# Patient Record
Sex: Female | Born: 1960
Health system: Southern US, Community
[De-identification: ages and names within clinical notes are randomized; demographics above are authoritative.]

## PROBLEM LIST (undated history)

## (undated) DIAGNOSIS — K589 Irritable bowel syndrome without diarrhea: Secondary | ICD-10-CM

## (undated) DIAGNOSIS — K2100 Gastro-esophageal reflux disease with esophagitis, without bleeding: Secondary | ICD-10-CM

## (undated) DIAGNOSIS — F32A Depression, unspecified: Secondary | ICD-10-CM

## (undated) DIAGNOSIS — Z8041 Family history of malignant neoplasm of ovary: Secondary | ICD-10-CM

## (undated) DIAGNOSIS — R32 Unspecified urinary incontinence: Secondary | ICD-10-CM

## (undated) DIAGNOSIS — F329 Major depressive disorder, single episode, unspecified: Secondary | ICD-10-CM

## (undated) DIAGNOSIS — Z803 Family history of malignant neoplasm of breast: Secondary | ICD-10-CM

## (undated) DIAGNOSIS — E669 Obesity, unspecified: Secondary | ICD-10-CM

## (undated) DIAGNOSIS — G43909 Migraine, unspecified, not intractable, without status migrainosus: Secondary | ICD-10-CM

## (undated) DIAGNOSIS — F419 Anxiety disorder, unspecified: Secondary | ICD-10-CM

## (undated) DIAGNOSIS — Z1371 Encounter for nonprocreative screening for genetic disease carrier status: Secondary | ICD-10-CM

## (undated) HISTORY — PX: TENDON REPAIR: SHX5111

## (undated) HISTORY — PX: LIPOSUCTION: SHX10

## (undated) HISTORY — PX: TUBAL LIGATION: SHX77

## (undated) HISTORY — DX: Family history of malignant neoplasm of ovary: Z80.41

## (undated) HISTORY — DX: Gastro-esophageal reflux disease with esophagitis, without bleeding: K21.00

## (undated) HISTORY — DX: Major depressive disorder, single episode, unspecified: F32.9

## (undated) HISTORY — DX: Anxiety disorder, unspecified: F41.9

## (undated) HISTORY — DX: Obesity, unspecified: E66.9

## (undated) HISTORY — DX: Gastro-esophageal reflux disease with esophagitis: K21.0

## (undated) HISTORY — DX: Encounter for nonprocreative screening for genetic disease carrier status: Z13.71

## (undated) HISTORY — PX: CHOLECYSTECTOMY: SHX55

## (undated) HISTORY — DX: Depression, unspecified: F32.A

## (undated) HISTORY — DX: Irritable bowel syndrome, unspecified: K58.9

## (undated) HISTORY — PX: ENDOMETRIAL ABLATION: SHX621

## (undated) HISTORY — DX: Family history of malignant neoplasm of breast: Z80.3

## (undated) HISTORY — DX: Migraine, unspecified, not intractable, without status migrainosus: G43.909

## (undated) HISTORY — DX: Unspecified urinary incontinence: R32

---

## 2011-08-21 DIAGNOSIS — Z1371 Encounter for nonprocreative screening for genetic disease carrier status: Secondary | ICD-10-CM

## 2011-08-21 HISTORY — DX: Encounter for nonprocreative screening for genetic disease carrier status: Z13.71

## 2013-09-14 ENCOUNTER — Other Ambulatory Visit: Payer: Self-pay | Admitting: Gastroenterology

## 2013-09-14 LAB — CLOSTRIDIUM DIFFICILE(ARMC)

## 2013-09-17 LAB — STOOL CULTURE

## 2013-09-24 ENCOUNTER — Other Ambulatory Visit: Payer: Self-pay | Admitting: Gastroenterology

## 2013-09-24 LAB — CLOSTRIDIUM DIFFICILE(ARMC)

## 2013-10-12 ENCOUNTER — Ambulatory Visit: Payer: Self-pay | Admitting: Gastroenterology

## 2013-10-13 LAB — PATHOLOGY REPORT

## 2014-12-23 ENCOUNTER — Encounter: Payer: 59 | Attending: Internal Medicine | Admitting: Dietician

## 2014-12-23 VITALS — Ht 67.0 in | Wt 231.0 lb

## 2014-12-23 DIAGNOSIS — E669 Obesity, unspecified: Secondary | ICD-10-CM | POA: Diagnosis present

## 2014-12-23 NOTE — Progress Notes (Signed)
Pt. In as a nutrition self-referral. Time start: 1:30pm Time stop- 2:15pm.  Her priority is desire to lose weight. 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  Instructed on a meal plan based on 1600 calories including carbohydrate counting and need to balance carbohydrate, protein and non-starchy vegetables. Discussed benefit of exercise with weight loss efforts.  Pt. Set goals to: Measure portions, especially of starchy foods Balance meals with protein, 2-3 carbohydrate servings and non-starchy vegetables. Exercise by walking 5 dys/week for 3 miles To resume some fun hobbies.  No follow-up scheduled. Pt. Was encouraged to call if has further questions/concerns or desires further help with diet/nutrition.

## 2014-12-23 NOTE — Patient Instructions (Signed)
Pt. Set goals to: Measure portions, especially of starchy foods Balance meals with protein, 2-3 carbohydrate servings and non-starchy vegetables. Exercise by walking 5 dys/week for 3 miles To resume some fun hobbies.

## 2015-10-17 DIAGNOSIS — H01004 Unspecified blepharitis left upper eyelid: Secondary | ICD-10-CM | POA: Diagnosis not present

## 2015-12-29 DIAGNOSIS — Z0000 Encounter for general adult medical examination without abnormal findings: Secondary | ICD-10-CM | POA: Diagnosis not present

## 2015-12-29 DIAGNOSIS — E669 Obesity, unspecified: Secondary | ICD-10-CM | POA: Diagnosis not present

## 2015-12-29 DIAGNOSIS — F329 Major depressive disorder, single episode, unspecified: Secondary | ICD-10-CM | POA: Diagnosis not present

## 2016-06-07 DIAGNOSIS — L989 Disorder of the skin and subcutaneous tissue, unspecified: Secondary | ICD-10-CM | POA: Diagnosis not present

## 2016-06-08 ENCOUNTER — Encounter: Payer: Self-pay | Admitting: General Surgery

## 2016-06-20 ENCOUNTER — Ambulatory Visit: Payer: Self-pay | Admitting: General Surgery

## 2016-06-21 ENCOUNTER — Encounter: Payer: Self-pay | Admitting: *Deleted

## 2016-11-01 DIAGNOSIS — H524 Presbyopia: Secondary | ICD-10-CM | POA: Diagnosis not present

## 2016-11-01 DIAGNOSIS — H353111 Nonexudative age-related macular degeneration, right eye, early dry stage: Secondary | ICD-10-CM | POA: Diagnosis not present

## 2016-11-30 DIAGNOSIS — H353131 Nonexudative age-related macular degeneration, bilateral, early dry stage: Secondary | ICD-10-CM | POA: Diagnosis not present

## 2016-12-25 DIAGNOSIS — Z0000 Encounter for general adult medical examination without abnormal findings: Secondary | ICD-10-CM | POA: Diagnosis not present

## 2017-01-01 DIAGNOSIS — Z0000 Encounter for general adult medical examination without abnormal findings: Secondary | ICD-10-CM | POA: Diagnosis not present

## 2017-01-01 DIAGNOSIS — I201 Angina pectoris with documented spasm: Secondary | ICD-10-CM | POA: Diagnosis not present

## 2017-01-01 DIAGNOSIS — F329 Major depressive disorder, single episode, unspecified: Secondary | ICD-10-CM | POA: Diagnosis not present

## 2017-04-24 ENCOUNTER — Ambulatory Visit (INDEPENDENT_AMBULATORY_CARE_PROVIDER_SITE_OTHER): Payer: 59 | Admitting: Obstetrics and Gynecology

## 2017-04-24 ENCOUNTER — Encounter: Payer: Self-pay | Admitting: Obstetrics and Gynecology

## 2017-04-24 VITALS — BP 128/86 | Ht 67.0 in | Wt 227.0 lb

## 2017-04-24 DIAGNOSIS — Z1331 Encounter for screening for depression: Secondary | ICD-10-CM

## 2017-04-24 DIAGNOSIS — Z124 Encounter for screening for malignant neoplasm of cervix: Secondary | ICD-10-CM | POA: Diagnosis not present

## 2017-04-24 DIAGNOSIS — Z1339 Encounter for screening examination for other mental health and behavioral disorders: Secondary | ICD-10-CM

## 2017-04-24 DIAGNOSIS — Z1389 Encounter for screening for other disorder: Secondary | ICD-10-CM | POA: Diagnosis not present

## 2017-04-24 DIAGNOSIS — Z01419 Encounter for gynecological examination (general) (routine) without abnormal findings: Secondary | ICD-10-CM | POA: Diagnosis not present

## 2017-04-24 LAB — HM PAP SMEAR: HM PAP: NORMAL

## 2017-04-24 NOTE — Progress Notes (Signed)
Routine Annual Gynecology Examination   PCP: Lauro RegulusAnderson, Marshall W, MD   Chief Complaint  Patient presents with  . Annual Exam    History of Present Illness: Patient is a 56 y.o. G2P2002 presents for annual exam. The patient has no complaints today.   Menopausal bleeding: denies.   Menopausal symptoms: denies. She is taking HRT from Dr. Dareen PianoAnderson  Breast symptoms: denies  Last pap smear: 5 years ago.  Result Normal  Last mammogram: several years ago.  Result Normal   Routine screening accomplished by her PCP, Dr. Einar CrowMarshall Anderson  Past Medical History:  Diagnosis Date  . Anxiety   . Depression   . Gastro-esophageal reflux disease with esophagitis   . IBS (irritable bowel syndrome)   . Incontinence of urine   . Migraine   . Obesity     Past Surgical History:  Procedure Laterality Date  . CHOLECYSTECTOMY    . ENDOMETRIAL ABLATION    . LIPOSUCTION    . TENDON REPAIR    . TUBAL LIGATION      Prior to Admission medications   Medication Sig Start Date End Date Taking? Authorizing Provider  acetaminophen (TYLENOL) 325 MG tablet Take by mouth.    [provider]  estradiol (ESTRACE) 1 MG tablet Take by mouth. 06/18/14 06/18/15  [provider]  medroxyPROGESTERone (PROVERA) 2.5 MG tablet Take by mouth. 06/18/14 06/18/15  [provider]  propranolol (INDERAL) 20 MG tablet Take by mouth. 06/18/14 06/18/15  [provider]    Allergies  Allergen Reactions  . Miconazole Other (See Comments)    Burning and irritation    Obstetric History: [id number]  Social History   Social History  . Marital status: Married    Spouse name: N/A  . Number of children: N/A  . Years of education: N/A   Occupational History  . Not on file.   Social History Main Topics  . Smoking status: Never Smoker  . Smokeless tobacco: Never Used  . Alcohol use Yes  . Drug use: No  . Sexual activity: Yes    Birth control/ protection: Surgical    Other Topics Concern  . Not on file   Social History Narrative  . No narrative on file    Family History  Problem Relation Age of Onset  . Prostate cancer Father   . Prostate cancer Brother   . Ovarian cancer Paternal Aunt   . Colon cancer Paternal Aunt   . Esophageal cancer Paternal Uncle     Review of Systems  Constitutional: Negative.   HENT: Negative.   Eyes: Negative.   Respiratory: Negative.   Cardiovascular: Negative.   Gastrointestinal: Negative.   Genitourinary: Negative.   Musculoskeletal: Negative.   Skin: Negative.   Neurological: Negative.   Psychiatric/Behavioral: Negative.      Physical Exam Vitals: BP 128/86   Ht 5\' 7"  (1.702 m)   Wt 227 lb (103 kg)   BMI 35.55 kg/m   Physical Exam  Constitutional: She is oriented to person, place, and time. She appears well-developed and well-nourished. No distress.  Genitourinary: Vagina normal and uterus normal. Pelvic exam was performed with patient supine. There is no rash, tenderness or lesion on the right labia. There is no rash, tenderness or lesion on the left labia. Vagina exhibits no lesion. No bleeding in the vagina. No foreign body in the vagina. No signs of injury around the vagina. Right adnexum does not display mass, does not display tenderness and does not display fullness.  Left adnexum does not display mass, does not display tenderness and does not display fullness. Cervix does not exhibit motion tenderness, lesion or polyp.   Uterus is mobile and anteverted. Uterus is not enlarged or exhibiting a mass.  HENT:  Head: Normocephalic and atraumatic.  Eyes: EOM are normal. No scleral icterus.  Neck: Normal range of motion. Neck supple.  Cardiovascular: Normal rate and regular rhythm.   Pulmonary/Chest: Effort normal and breath sounds normal. No respiratory distress. She has no wheezes. She has no rales. Right breast exhibits no inverted nipple, no mass, no nipple discharge, no skin change and no  tenderness. Left breast exhibits no inverted nipple, no mass, no nipple discharge, no skin change and no tenderness.  Abdominal: Soft. Bowel sounds are normal. She exhibits no distension and no mass. There is no tenderness. There is no rebound and no guarding.  Musculoskeletal: Normal range of motion. She exhibits no edema.  Neurological: She is alert and oriented to person, place, and time. No cranial nerve deficit.  Skin: Skin is warm and dry. No erythema.  Psychiatric: She has a normal mood and affect. Her behavior is normal. Judgment normal.    Female chaperone present for pelvic and breast  portions of the physical exam  Results: AUDIT Questionnaire (screen for alcoholism): 2 PHQ-9: 1   Assessment and Plan:  56 y.o. G73P2002 female here for routine annual gynecologic examination  Plan: Problem List Items Addressed This Visit    None      Screening: -- Blood pressure screen normal -- Colonoscopy - due - managed by PCP -- Mammogram - due. Patient to call [facility] to arrange. She understands that it is her responsibility to arrange this. -- Weight screening: obese: discussed management options, including lifestyle, dietary, and exercise. -- Depression screening negative (PHQ-9) -- Nutrition: normal -- cholesterol screening: per PCP -- osteoporosis screening: not due -- tobacco screening: not using -- alcohol screening: AUDIT questionnaire indicates low-risk usage. -- family history of breast cancer screening: done. not at high risk. -- no evidence of domestic violence or intimate partner violence. -- STD screening: gonorrhea/chlamydia NAAT not collected per patient request. -- pap smear collected per ASCCP guidelines -- flu vaccine to receive at work -- HPV vaccination series: not Kristen Loader, MD 04/24/2017 3:14 PM

## 2017-04-27 LAB — IGP, APTIMA HPV, RFX 16/18,45
HPV APTIMA: NEGATIVE
PAP Smear Comment: 0

## 2017-04-29 ENCOUNTER — Encounter: Payer: Self-pay | Admitting: Obstetrics and Gynecology

## 2017-06-24 DIAGNOSIS — M5442 Lumbago with sciatica, left side: Secondary | ICD-10-CM | POA: Diagnosis not present

## 2017-06-24 DIAGNOSIS — M545 Low back pain: Secondary | ICD-10-CM | POA: Diagnosis not present

## 2017-08-26 DIAGNOSIS — M5431 Sciatica, right side: Secondary | ICD-10-CM | POA: Diagnosis not present

## 2017-09-07 DIAGNOSIS — M545 Low back pain: Secondary | ICD-10-CM | POA: Diagnosis not present

## 2017-09-16 DIAGNOSIS — M5136 Other intervertebral disc degeneration, lumbar region: Secondary | ICD-10-CM | POA: Diagnosis not present

## 2017-09-16 DIAGNOSIS — M5416 Radiculopathy, lumbar region: Secondary | ICD-10-CM | POA: Diagnosis not present

## 2017-10-26 DIAGNOSIS — J1189 Influenza due to unidentified influenza virus with other manifestations: Secondary | ICD-10-CM | POA: Diagnosis not present

## 2017-10-29 MED FILL — DUKE'S MOUTHWASH: 7 days supply | Qty: 280 | Fill #0

## 2017-11-11 DIAGNOSIS — H353111 Nonexudative age-related macular degeneration, right eye, early dry stage: Secondary | ICD-10-CM | POA: Diagnosis not present

## 2017-11-11 DIAGNOSIS — M5136 Other intervertebral disc degeneration, lumbar region: Secondary | ICD-10-CM | POA: Diagnosis not present

## 2017-11-11 DIAGNOSIS — H524 Presbyopia: Secondary | ICD-10-CM | POA: Diagnosis not present

## 2017-11-11 DIAGNOSIS — M5416 Radiculopathy, lumbar region: Secondary | ICD-10-CM | POA: Diagnosis not present

## 2017-12-02 DIAGNOSIS — H9202 Otalgia, left ear: Secondary | ICD-10-CM | POA: Diagnosis not present

## 2017-12-02 DIAGNOSIS — R05 Cough: Secondary | ICD-10-CM | POA: Diagnosis not present

## 2017-12-02 DIAGNOSIS — J029 Acute pharyngitis, unspecified: Secondary | ICD-10-CM | POA: Diagnosis not present

## 2017-12-05 DIAGNOSIS — J029 Acute pharyngitis, unspecified: Secondary | ICD-10-CM | POA: Diagnosis not present

## 2017-12-05 DIAGNOSIS — H9202 Otalgia, left ear: Secondary | ICD-10-CM | POA: Diagnosis not present

## 2017-12-13 DIAGNOSIS — H9192 Unspecified hearing loss, left ear: Secondary | ICD-10-CM | POA: Diagnosis not present

## 2017-12-13 DIAGNOSIS — R209 Unspecified disturbances of skin sensation: Secondary | ICD-10-CM | POA: Diagnosis not present

## 2017-12-21 DIAGNOSIS — B349 Viral infection, unspecified: Secondary | ICD-10-CM | POA: Diagnosis not present

## 2017-12-21 DIAGNOSIS — K521 Toxic gastroenteritis and colitis: Secondary | ICD-10-CM | POA: Diagnosis not present

## 2017-12-23 ENCOUNTER — Other Ambulatory Visit
Admission: RE | Admit: 2017-12-23 | Discharge: 2017-12-23 | Disposition: A | Discharge status: Home / Self Care | Payer: 59 | Source: Ambulatory Visit | Attending: Family Medicine | Admitting: Family Medicine

## 2017-12-23 DIAGNOSIS — B349 Viral infection, unspecified: Secondary | ICD-10-CM | POA: Insufficient documentation

## 2017-12-23 DIAGNOSIS — K521 Toxic gastroenteritis and colitis: Secondary | ICD-10-CM | POA: Insufficient documentation

## 2017-12-23 LAB — C DIFFICILE QUICK SCREEN W PCR REFLEX
C Diff antigen: NEGATIVE
C Diff interpretation: NOT DETECTED
C Diff toxin: NEGATIVE

## 2017-12-24 ENCOUNTER — Other Ambulatory Visit: Payer: Self-pay | Admitting: Physician Assistant

## 2017-12-24 DIAGNOSIS — J019 Acute sinusitis, unspecified: Secondary | ICD-10-CM

## 2017-12-24 DIAGNOSIS — H6980 Other specified disorders of Eustachian tube, unspecified ear: Secondary | ICD-10-CM | POA: Diagnosis not present

## 2017-12-24 DIAGNOSIS — H93299 Other abnormal auditory perceptions, unspecified ear: Secondary | ICD-10-CM | POA: Diagnosis not present

## 2017-12-24 DIAGNOSIS — H9042 Sensorineural hearing loss, unilateral, left ear, with unrestricted hearing on the contralateral side: Secondary | ICD-10-CM | POA: Diagnosis not present

## 2017-12-30 DIAGNOSIS — I201 Angina pectoris with documented spasm: Secondary | ICD-10-CM | POA: Diagnosis not present

## 2017-12-30 DIAGNOSIS — Z0000 Encounter for general adult medical examination without abnormal findings: Secondary | ICD-10-CM | POA: Diagnosis not present

## 2017-12-31 ENCOUNTER — Ambulatory Visit
Admission: RE | Admit: 2017-12-31 | Discharge: 2017-12-31 | Disposition: A | Discharge status: Home / Self Care | Payer: 59 | Source: Ambulatory Visit | Attending: Physician Assistant | Admitting: Physician Assistant

## 2017-12-31 DIAGNOSIS — J329 Chronic sinusitis, unspecified: Secondary | ICD-10-CM | POA: Diagnosis present

## 2017-12-31 DIAGNOSIS — J328 Other chronic sinusitis: Secondary | ICD-10-CM | POA: Insufficient documentation

## 2017-12-31 DIAGNOSIS — J320 Chronic maxillary sinusitis: Secondary | ICD-10-CM | POA: Diagnosis not present

## 2017-12-31 DIAGNOSIS — J018 Other acute sinusitis: Secondary | ICD-10-CM | POA: Diagnosis not present

## 2017-12-31 DIAGNOSIS — J019 Acute sinusitis, unspecified: Secondary | ICD-10-CM

## 2018-01-06 DIAGNOSIS — I201 Angina pectoris with documented spasm: Secondary | ICD-10-CM | POA: Diagnosis not present

## 2018-01-06 DIAGNOSIS — F329 Major depressive disorder, single episode, unspecified: Secondary | ICD-10-CM | POA: Diagnosis not present

## 2018-01-06 DIAGNOSIS — Z0000 Encounter for general adult medical examination without abnormal findings: Secondary | ICD-10-CM | POA: Diagnosis not present

## 2018-01-09 DIAGNOSIS — J4520 Mild intermittent asthma, uncomplicated: Secondary | ICD-10-CM | POA: Diagnosis not present

## 2018-01-09 DIAGNOSIS — K219 Gastro-esophageal reflux disease without esophagitis: Secondary | ICD-10-CM | POA: Diagnosis not present

## 2018-01-09 DIAGNOSIS — R05 Cough: Secondary | ICD-10-CM | POA: Diagnosis not present

## 2018-01-09 DIAGNOSIS — J309 Allergic rhinitis, unspecified: Secondary | ICD-10-CM | POA: Diagnosis not present

## 2018-01-09 DIAGNOSIS — J342 Deviated nasal septum: Secondary | ICD-10-CM | POA: Diagnosis not present

## 2018-01-09 DIAGNOSIS — J3489 Other specified disorders of nose and nasal sinuses: Secondary | ICD-10-CM | POA: Diagnosis not present

## 2018-01-09 DIAGNOSIS — J329 Chronic sinusitis, unspecified: Secondary | ICD-10-CM | POA: Diagnosis not present

## 2018-01-09 DIAGNOSIS — J019 Acute sinusitis, unspecified: Secondary | ICD-10-CM | POA: Diagnosis not present

## 2018-02-10 DIAGNOSIS — K219 Gastro-esophageal reflux disease without esophagitis: Secondary | ICD-10-CM | POA: Diagnosis not present

## 2018-02-10 DIAGNOSIS — J309 Allergic rhinitis, unspecified: Secondary | ICD-10-CM | POA: Diagnosis not present

## 2018-02-10 DIAGNOSIS — R05 Cough: Secondary | ICD-10-CM | POA: Diagnosis not present

## 2018-09-08 ENCOUNTER — Ambulatory Visit (INDEPENDENT_AMBULATORY_CARE_PROVIDER_SITE_OTHER): Payer: No Typology Code available for payment source | Admitting: Obstetrics and Gynecology

## 2018-09-08 ENCOUNTER — Encounter: Payer: Self-pay | Admitting: Obstetrics and Gynecology

## 2018-09-08 VITALS — BP 124/78 | Ht 67.0 in | Wt 218.0 lb

## 2018-09-08 DIAGNOSIS — Z01419 Encounter for gynecological examination (general) (routine) without abnormal findings: Secondary | ICD-10-CM

## 2018-09-08 DIAGNOSIS — Z1331 Encounter for screening for depression: Secondary | ICD-10-CM

## 2018-09-08 DIAGNOSIS — Z1339 Encounter for screening examination for other mental health and behavioral disorders: Secondary | ICD-10-CM

## 2018-09-08 NOTE — Progress Notes (Signed)
Routine Annual Gynecology Examination   PCP: Kirk Ruths, MD  Chief Complaint  Patient presents with  . Annual Exam   History of Present Illness: Patient is a 58 y.o. [id number] presents for annual exam. The patient has no complaints today.   Menopausal bleeding: denies  Menopausal symptoms: denies, taking HRT from Dr. Ouida Sills, who is managing this currently.   Breast symptoms: denies  Last pap smear: 1 year ago.  Result Normal, HPV negative  Last mammogram: several years ago.  Result Normal  Colonoscopy: not done so far.  Dr. Ouida Sills to refer, per the patient.  Past Medical History:  Diagnosis Date  . Anxiety   . BRCA negative 2013   BRCA/Colaris neg  . Depression   . Family history of breast cancer    updated genetic testing letter sent 9/18  . Family history of ovarian cancer   . Gastro-esophageal reflux disease with esophagitis   . IBS (irritable bowel syndrome)   . Incontinence of urine   . Migraine   . Obesity    Past Surgical History:  Procedure Laterality Date  . CHOLECYSTECTOMY    . ENDOMETRIAL ABLATION    . LIPOSUCTION    . TENDON REPAIR    . TUBAL LIGATION     Prior to Admission medications   Medication Sig Start Date End Date Taking? Authorizing Provider  acetaminophen (TYLENOL) 325 MG tablet Take by mouth.   Yes [provider]  buPROPion (WELLBUTRIN XL) 150 MG 24 hr tablet  06/18/18  Yes [provider]  estradiol (ESTRACE) 1 MG tablet  06/18/18  Yes [provider]  gabapentin (NEURONTIN) 100 MG capsule  08/28/18  Yes [provider]  medroxyPROGESTERone (PROVERA) 2.5 MG tablet  06/18/18  Yes [provider]  estradiol (ESTRACE) 1 MG tablet Take by mouth. 06/18/14 06/18/15  [provider]  medroxyPROGESTERone (PROVERA) 2.5 MG tablet Take by mouth. 06/18/14 06/18/15  [provider]  propranolol (INDERAL) 20 MG tablet Take by mouth. 06/18/14 06/18/15  [provider]    Allergies  Allergen Reactions  . Miconazole Other (See Comments)    Burning and irritation   Obstetric History: [id number], s/p SVD x 2  Social History   Socioeconomic History  . Marital status: Married    Spouse name: Not on file  . Number of children: Not on file  . Years of education: Not on file  . Highest education level: Not on file  Occupational History  . Not on file  Social Needs  . Financial resource strain: Not on file  . Food insecurity:    Worry: Not on file    Inability: Not on file  . Transportation needs:    Medical: Not on file    Non-medical: Not on file  Tobacco Use  . Smoking status: Never Smoker  . Smokeless tobacco: Never Used  Substance and Sexual Activity  . Alcohol use: Yes  . Drug use: No  . Sexual activity: Yes    Birth control/protection: Surgical  Lifestyle  . Physical activity:    Days per week: Not on file    Minutes per session: Not on file  . Stress: Not on file  Relationships  . Social connections:    Talks on phone: Not on file    Gets together: Not on file    Attends religious service: Not on file    Active member of club or organization: Not on file    Attends meetings of clubs or  organizations: Not on file    Relationship status: Not on file  . Intimate partner violence:    Fear of current or ex partner: Not on file    Emotionally abused: Not on file    Physically abused: Not on file    Forced sexual activity: Not on file  Other Topics Concern  . Not on file  Social History Narrative  . Not on file    Family History  Problem Relation Age of Onset  . Prostate cancer Father   . Prostate cancer Brother   . Ovarian cancer Paternal Aunt   . Colon cancer Paternal Aunt   . Breast cancer Paternal Aunt   . Esophageal cancer Paternal Uncle   . Breast cancer Maternal Grandmother     Review of Systems  Constitutional: Negative.   HENT: Negative.   Eyes: Negative.   Respiratory: Negative.   Cardiovascular: Negative.     Gastrointestinal: Negative.   Genitourinary: Negative.   Musculoskeletal: Negative.   Skin: Negative.   Neurological: Negative.   Psychiatric/Behavioral: Negative.      Physical Exam Vitals: BP 124/78   Ht _0  (1.702 m)   Wt 218 lb (98.9 kg)   BMI 34.14 kg/m   Physical Exam Constitutional:      General: She is not in acute distress.    Appearance: Normal appearance. She is well-developed.  Genitourinary:     Pelvic exam was performed with patient supine.     Uterus normal.     No inguinal adenopathy present in the right or left side.    No signs of injury in the vagina.     No vaginal discharge, erythema, tenderness or bleeding.     No cervical motion tenderness, discharge, lesion or polyp.     Uterus is mobile.     Uterus is not enlarged or tender.     No uterine mass detected.    Uterus is anteverted.     No right or left adnexal mass present.     Right adnexa not tender or full.     Left adnexa not tender or full.  HENT:     Head: Normocephalic and atraumatic.  Eyes:     General: No scleral icterus.    Conjunctiva/sclera: Conjunctivae normal.  Neck:     Musculoskeletal: Normal range of motion and neck supple.     Thyroid: No thyromegaly.  Cardiovascular:     Rate and Rhythm: Normal rate and regular rhythm.     Heart sounds: No murmur. No friction rub. No gallop.   Pulmonary:     Effort: Pulmonary effort is normal. No respiratory distress.     Breath sounds: Normal breath sounds. No wheezing or rales.  Chest:     Breasts:        Right: No inverted nipple, mass, nipple discharge, skin change or tenderness.        Left: No inverted nipple, mass, nipple discharge, skin change or tenderness.  Abdominal:     General: Bowel sounds are normal. There is no distension.     Palpations: Abdomen is soft. There is no mass.     Tenderness: There is no abdominal tenderness. There is no guarding or rebound.  Musculoskeletal: Normal range of motion.        General: No  tenderness.  Lymphadenopathy:     Cervical: No cervical adenopathy.     Lower Body: No right inguinal adenopathy. No left inguinal adenopathy.  Neurological:     General:  No focal deficit present.     Mental Status: She is alert and oriented to person, place, and time.     Cranial Nerves: No cranial nerve deficit.  Skin:    General: Skin is warm and dry.     Findings: No erythema or rash.  Psychiatric:        Mood and Affect: Mood normal.        Behavior: Behavior normal.        Judgment: Judgment normal.      Female chaperone present for pelvic and breast  portions of the physical exam  Results: AUDIT Questionnaire (screen for alcoholism): 2 PHQ-9: 0   Assessment and Plan:  58 y.o. G2P2002 female here for routine annual gynecologic examination  Plan: Problem List Items Addressed This Visit    None    Visit Diagnoses    Women's annual routine gynecological examination    -  Primary   Screening for depression       Screening for alcoholism          Screening: -- Blood pressure screen normal -- Colonoscopy - due - managed by PCP -- Mammogram - due. Patient to call [facility] to arrange. She understands that it is her responsibility to arrange this. -- Weight screening: obese: discussed management options, including lifestyle, dietary, and exercise. -- Depression screening negative (PHQ-9) -- Nutrition: normal -- cholesterol screening: per PCP -- osteoporosis screening: not due -- tobacco screening: not using -- alcohol screening: AUDIT questionnaire indicates low-risk usage. -- family history of breast cancer screening: done. not at high risk. -- no evidence of domestic violence or intimate partner violence. -- STD screening: gonorrhea/chlamydia NAAT not collected per patient request. -- pap smear not collected per ASCCP guidelines -- flu vaccine received -- HPV vaccination series: not eligilbe  Encouraged mammography and colonoscopy, both of which she states she  will obtain.    Prentice Docker, MD 09/08/2018 12:20 PM

## 2018-09-10 ENCOUNTER — Encounter: Payer: Self-pay | Admitting: Obstetrics and Gynecology

## 2018-09-12 ENCOUNTER — Other Ambulatory Visit: Payer: Self-pay | Admitting: Obstetrics and Gynecology

## 2018-09-12 DIAGNOSIS — Z1231 Encounter for screening mammogram for malignant neoplasm of breast: Secondary | ICD-10-CM

## 2018-09-26 ENCOUNTER — Telehealth: Payer: Self-pay

## 2018-09-26 NOTE — Telephone Encounter (Signed)
SDJ this is an Pharmacist, hospital. It looks like [facility] sent patient a letter in January stating she is eligible for update testing through [facility].

## 2018-09-26 NOTE — Telephone Encounter (Signed)
OK. I would double-check in [facility] and maybe see the green sheet she filled out at her last appointment to make sure it is complete.  Thank you

## 2018-09-26 NOTE — Telephone Encounter (Signed)
I received a call from [employer] from [facility]. She is needing clinical notes on patient regarding BRCA testing. They need detailed family history of patient. Fax# [phone] Case# [id number]  I will fax last clinical notes from Dr. Glennon Mac. KJ CMA

## 2018-10-01 ENCOUNTER — Ambulatory Visit
Admission: RE | Admit: 2018-10-01 | Discharge: 2018-10-01 | Disposition: A | Discharge status: Home / Self Care | Payer: No Typology Code available for payment source | Source: Ambulatory Visit | Attending: Obstetrics and Gynecology | Admitting: Obstetrics and Gynecology

## 2018-10-01 DIAGNOSIS — Z539 Procedure and treatment not carried out, unspecified reason: Secondary | ICD-10-CM | POA: Insufficient documentation

## 2018-10-01 DIAGNOSIS — Z1231 Encounter for screening mammogram for malignant neoplasm of breast: Secondary | ICD-10-CM | POA: Diagnosis not present

## 2019-09-30 ENCOUNTER — Ambulatory Visit: Payer: No Typology Code available for payment source | Admitting: Obstetrics and Gynecology

## 2019-10-28 ENCOUNTER — Other Ambulatory Visit: Payer: Self-pay

## 2019-10-28 ENCOUNTER — Encounter: Payer: Self-pay | Admitting: Obstetrics and Gynecology

## 2019-10-28 ENCOUNTER — Ambulatory Visit (INDEPENDENT_AMBULATORY_CARE_PROVIDER_SITE_OTHER): Payer: No Typology Code available for payment source | Admitting: Obstetrics and Gynecology

## 2019-10-28 ENCOUNTER — Telehealth: Payer: Self-pay

## 2019-10-28 VITALS — BP 126/84 | Ht 67.0 in | Wt 227.0 lb

## 2019-10-28 DIAGNOSIS — Z8371 Family history of colonic polyps: Secondary | ICD-10-CM

## 2019-10-28 DIAGNOSIS — Z1339 Encounter for screening examination for other mental health and behavioral disorders: Secondary | ICD-10-CM

## 2019-10-28 DIAGNOSIS — Z1211 Encounter for screening for malignant neoplasm of colon: Secondary | ICD-10-CM

## 2019-10-28 DIAGNOSIS — Z1331 Encounter for screening for depression: Secondary | ICD-10-CM | POA: Diagnosis not present

## 2019-10-28 DIAGNOSIS — Z01419 Encounter for gynecological examination (general) (routine) without abnormal findings: Secondary | ICD-10-CM | POA: Diagnosis not present

## 2019-10-28 NOTE — Telephone Encounter (Signed)
Gastroenterology Pre-Procedure Review  Request Date: Friday 12/11/19 Requesting Physician: Dr. Servando Snare  PATIENT REVIEW QUESTIONS: The patient responded to the following health history questions as indicated:    1. Are you having any GI issues? yes (chronic IBS-D) 2. Do you have a personal history of Polyps? no 3. Do you have a family history of Colon Cancer or Polyps? yes (paternal grandmother) 4. Diabetes Mellitus? no 5. Joint replacements in the past 12 months?no 6. Major health problems in the past 3 months?no 7. Any artificial heart valves, MVP, or defibrillator?no    MEDICATIONS & ALLERGIES:    Patient reports the following regarding taking any anticoagulation/antiplatelet therapy:   Plavix, Coumadin, Eliquis, Xarelto, Lovenox, Pradaxa, Brilinta, or Effient? no Aspirin? no  Patient confirms/reports the following medications:  Current Outpatient Medications  Medication Sig Dispense Refill  . acetaminophen (TYLENOL) 325 MG tablet Take by mouth.    Marland Kitchen buPROPion (WELLBUTRIN XL) 150 MG 24 hr tablet     . estradiol (ESTRACE) 1 MG tablet Take by mouth.    . estradiol (ESTRACE) 1 MG tablet     . gabapentin (NEURONTIN) 100 MG capsule     . medroxyPROGESTERone (PROVERA) 2.5 MG tablet Take by mouth.    . medroxyPROGESTERone (PROVERA) 2.5 MG tablet     . propranolol (INDERAL) 20 MG tablet Take by mouth.     No current facility-administered medications for this visit.    Patient confirms/reports the following allergies:  Allergies  Allergen Reactions  . Oseltamivir Other (See Comments)    Mouth sore, swollen joints  . Miconazole Other (See Comments)    Burning and irritation  . Hyoscyamine Sulfate Rash    No orders of the defined types were placed in this encounter.   AUTHORIZATION INFORMATION Primary Insurance: 1D#: Group #:  Secondary Insurance: 1D#: Group #:  SCHEDULE INFORMATION: Date: Friday 12/11/19 Time: Location:[facility]

## 2019-10-28 NOTE — Progress Notes (Signed)
Routine Annual Gynecology Examination   PCP: Kirk Ruths, MD  Chief Complaint  Patient presents with  . Annual Exam   History of Present Illness: Patient is a 59 y.o. G2P2002 presents for annual exam. The patient has no complaints today.   Menopausal bleeding: denies  Menopausal symptoms: she notes some warmth in the middle of the night, but doesn't break out into a sweat. She has some cold intolerance in her feet.  Breast symptoms: denies  Last pap smear: 2.5 years ago.  Result Normal, HPV negative  Last mammogram: 1 years ago.  Result Normal.  Will schedule mammogram after this appt.   Last Colonoscopy: still has not had.  Will place referral.   Has received both covid vaccine shots.   She continues to wean her HRT.  She is down to twice a week now.    Past Medical History:  Diagnosis Date  . Anxiety   . BRCA negative 2013   BRCA/Colaris neg  . Depression   . Family history of breast cancer    updated genetic testing letter sent 9/18, 1/20  . Family history of ovarian cancer   . Gastro-esophageal reflux disease with esophagitis   . IBS (irritable bowel syndrome)   . Incontinence of urine   . Migraine   . Obesity     Past Surgical History:  Procedure Laterality Date  . CHOLECYSTECTOMY    . ENDOMETRIAL ABLATION    . LIPOSUCTION    . TENDON REPAIR    . TUBAL LIGATION      Prior to Admission medications   Medication Sig Start Date End Date Taking? Authorizing Provider  acetaminophen (TYLENOL) 325 MG tablet Take by mouth.   Yes [provider]  estradiol (ESTRACE) 1 MG tablet  06/18/18  Yes [provider]  medroxyPROGESTERone (PROVERA) 2.5 MG tablet  06/18/18  Yes [provider]  buPROPion (WELLBUTRIN XL) 150 MG 24 hr tablet  06/18/18   [provider]  estradiol (ESTRACE) 1 MG tablet Take by mouth. 06/18/14 06/18/15  [provider]  gabapentin (NEURONTIN) 100 MG capsule  08/28/18   [provider]  medroxyPROGESTERone (PROVERA) 2.5 MG tablet Take by mouth. 06/18/14 06/18/15  [provider]  propranolol (INDERAL) 20 MG tablet Take by mouth. 06/18/14 06/18/15  [provider]    Allergies  Allergen Reactions  . Miconazole Other (See Comments)    Burning and irritation   Obstetric History: [id number]  Social History   Socioeconomic History  . Marital status: Married    Spouse name: Not on file  . Number of children: Not on file  . Years of education: Not on file  . Highest education level: Not on file  Occupational History  . Not on file  Tobacco Use  . Smoking status: Never Smoker  . Smokeless tobacco: Never Used  Substance and Sexual Activity  . Alcohol use: Yes  . Drug use: No  . Sexual activity: Yes    Birth control/protection: Surgical  Other Topics Concern  . Not on file  Social History Narrative  . Not on file   Social Determinants of Health   Financial Resource Strain:   . Difficulty of Paying Living Expenses: Not on file  Food Insecurity:   . Worried About Charity fundraiser in the Last Year: Not on file  . Ran Out of Food in the Last Year: Not on file  Transportation Needs:   . Lack of Transportation (Medical): Not on  file  . Lack of Transportation (Non-Medical): Not on file  Physical Activity:   . Days of Exercise per Week: Not on file  . Minutes of Exercise per Session: Not on file  Stress:   . Feeling of Stress : Not on file  Social Connections:   . Frequency of Communication with Friends and Family: Not on file  . Frequency of Social Gatherings with Friends and Family: Not on file  . Attends Religious Services: Not on file  . Active Member of Clubs or Organizations: Not on file  . Attends Archivist Meetings: Not on file  . Marital Status: Not on file  Intimate Partner Violence:   . Fear of Current or Ex-Partner: Not on file  . Emotionally Abused: Not on file  . Physically Abused: Not on file  .  Sexually Abused: Not on file    Family History  Problem Relation Age of Onset  . Prostate cancer Father   . Prostate cancer Brother   . Ovarian cancer Paternal Aunt   . Colon cancer Paternal Aunt   . Breast cancer Paternal Aunt   . Esophageal cancer Paternal Uncle   . Breast cancer Maternal Grandmother    Review of Systems  Constitutional: Negative.   HENT: Negative.   Eyes: Negative.   Respiratory: Positive for shortness of breath (believes has to do with mask. Has seen ENT). Negative for cough, hemoptysis, sputum production and wheezing.   Cardiovascular: Negative.   Gastrointestinal: Positive for diarrhea (normal for her). Negative for abdominal pain, blood in stool, constipation, heartburn, melena, nausea and vomiting.       Change in appetite - more hunger.  When she eats, she feels more bloated and uncomfortable.  Genitourinary: Negative.   Musculoskeletal: Negative.   Skin: Negative.   Neurological: Positive for headaches (her normal headaches). Negative for dizziness, tingling, tremors, sensory change, speech change, focal weakness, seizures, loss of consciousness and weakness.  Psychiatric/Behavioral: Negative.      Physical Exam Vitals: BP 126/84   Ht '5\' 7"'$  (1.702 m)   Wt 227 lb (103 kg)   BMI 35.55 kg/m   Physical Exam Constitutional:      General: She is not in acute distress.    Appearance: Normal appearance. She is well-developed.  Genitourinary:     Pelvic exam was performed with patient supine.     Vulva, urethra, bladder and uterus normal.     No inguinal adenopathy present in the right or left side.    No signs of injury in the vagina.     No vaginal discharge, erythema, tenderness or bleeding.     No cervical motion tenderness, discharge, lesion or polyp.     Uterus is mobile.     Uterus is not enlarged or tender.     No uterine mass detected.    Uterus is anteverted.     No right or left adnexal mass present.     Right adnexa not tender or full.      Left adnexa not tender or full.  HENT:     Head: Normocephalic and atraumatic.  Eyes:     General: No scleral icterus.    Conjunctiva/sclera: Conjunctivae normal.  Neck:     Thyroid: No thyromegaly.  Cardiovascular:     Rate and Rhythm: Normal rate and regular rhythm.     Heart sounds: No murmur. No friction rub. No gallop.   Pulmonary:     Effort: Pulmonary effort is normal. No respiratory distress.  Breath sounds: Normal breath sounds. No wheezing or rales.  Chest:     Breasts:        Right: No inverted nipple, mass, nipple discharge, skin change or tenderness.        Left: No inverted nipple, mass, nipple discharge, skin change or tenderness.  Abdominal:     General: Bowel sounds are normal. There is no distension.     Palpations: Abdomen is soft. There is no mass.     Tenderness: There is no abdominal tenderness. There is no guarding or rebound.  Musculoskeletal:        General: No swelling or tenderness. Normal range of motion.     Cervical back: Normal range of motion and neck supple.  Lymphadenopathy:     Cervical: No cervical adenopathy.     Lower Body: No right inguinal adenopathy. No left inguinal adenopathy.  Neurological:     General: No focal deficit present.     Mental Status: She is alert and oriented to person, place, and time.     Cranial Nerves: No cranial nerve deficit.  Skin:    General: Skin is warm and dry.     Findings: No erythema or rash.  Psychiatric:        Mood and Affect: Mood normal.        Behavior: Behavior normal.        Judgment: Judgment normal.     Female chaperone present for pelvic and breast  portions of the physical exam  Results: AUDIT Questionnaire (screen for alcoholism): 2 PHQ-9: 1   Assessment and Plan:  59 y.o. G29P2002 female here for routine annual gynecologic examination  Plan: Problem List Items Addressed This Visit    None    Visit Diagnoses    Women's annual routine gynecological examination    -   Primary   Relevant Orders   Ambulatory referral to Gastroenterology   Screening for depression       Screening for alcoholism       Screen for colon cancer       Relevant Orders   Ambulatory referral to Gastroenterology      Screening: -- Blood pressure screen normal -- Colonoscopy - due - will schedule -- Mammogram - due. Patient to call [facility] to arrange. She understands that it is her responsibility to arrange this. -- Weight screening: overweight: continue to monitor -- Depression screening negative (PHQ-9) -- Nutrition: normal -- cholesterol screening: per PCP -- osteoporosis screening: not due -- tobacco screening: not using -- alcohol screening: AUDIT questionnaire indicates low-risk usage. -- family history of breast cancer screening: done. not at high risk. -- no evidence of domestic violence or intimate partner violence. -- STD screening: gonorrhea/chlamydia NAAT not collected per patient request. -- pap smear not collected per ASCCP guidelines -- flu vaccine received at work -- HPV vaccination series: not Lanetta Inch, MD 10/28/2019 10:23 AM

## 2019-11-02 ENCOUNTER — Other Ambulatory Visit: Payer: Self-pay | Admitting: Obstetrics and Gynecology

## 2019-11-02 DIAGNOSIS — Z1231 Encounter for screening mammogram for malignant neoplasm of breast: Secondary | ICD-10-CM

## 2019-11-18 ENCOUNTER — Ambulatory Visit
Admission: RE | Admit: 2019-11-18 | Discharge: 2019-11-18 | Disposition: A | Discharge status: Home / Self Care | Payer: No Typology Code available for payment source | Source: Ambulatory Visit | Attending: Obstetrics and Gynecology | Admitting: Obstetrics and Gynecology

## 2019-11-18 DIAGNOSIS — Z1231 Encounter for screening mammogram for malignant neoplasm of breast: Secondary | ICD-10-CM | POA: Insufficient documentation

## 2019-12-09 ENCOUNTER — Other Ambulatory Visit: Payer: Self-pay

## 2019-12-09 ENCOUNTER — Other Ambulatory Visit
Admission: RE | Admit: 2019-12-09 | Discharge: 2019-12-09 | Disposition: A | Discharge status: Home / Self Care | Payer: No Typology Code available for payment source | Source: Ambulatory Visit | Attending: Gastroenterology | Admitting: Gastroenterology

## 2019-12-09 DIAGNOSIS — Z01812 Encounter for preprocedural laboratory examination: Secondary | ICD-10-CM | POA: Diagnosis present

## 2019-12-09 DIAGNOSIS — Z20822 Contact with and (suspected) exposure to covid-19: Secondary | ICD-10-CM | POA: Insufficient documentation

## 2019-12-09 LAB — SARS CORONAVIRUS 2 (TAT 6-24 HRS): SARS Coronavirus 2: NEGATIVE

## 2019-12-10 ENCOUNTER — Encounter: Payer: Self-pay | Admitting: Gastroenterology

## 2019-12-11 ENCOUNTER — Ambulatory Visit
Admission: RE | Admit: 2019-12-11 | Discharge: 2019-12-11 | Disposition: A | Discharge status: Home / Self Care | Payer: No Typology Code available for payment source | Attending: Gastroenterology | Admitting: Gastroenterology

## 2019-12-11 ENCOUNTER — Ambulatory Visit: Payer: No Typology Code available for payment source | Admitting: Registered Nurse

## 2019-12-11 ENCOUNTER — Encounter: Payer: Self-pay | Admitting: Gastroenterology

## 2019-12-11 ENCOUNTER — Other Ambulatory Visit: Payer: Self-pay

## 2019-12-11 ENCOUNTER — Encounter
Admission: RE | Disposition: A | Discharge status: Home / Self Care | Payer: Self-pay | Source: Home / Self Care | Attending: Gastroenterology

## 2019-12-11 DIAGNOSIS — F329 Major depressive disorder, single episode, unspecified: Secondary | ICD-10-CM | POA: Insufficient documentation

## 2019-12-11 DIAGNOSIS — Z79890 Hormone replacement therapy (postmenopausal): Secondary | ICD-10-CM | POA: Diagnosis not present

## 2019-12-11 DIAGNOSIS — Z79899 Other long term (current) drug therapy: Secondary | ICD-10-CM | POA: Diagnosis not present

## 2019-12-11 DIAGNOSIS — Z8371 Family history of colonic polyps: Secondary | ICD-10-CM | POA: Insufficient documentation

## 2019-12-11 DIAGNOSIS — Z888 Allergy status to other drugs, medicaments and biological substances status: Secondary | ICD-10-CM | POA: Diagnosis not present

## 2019-12-11 DIAGNOSIS — Z1211 Encounter for screening for malignant neoplasm of colon: Secondary | ICD-10-CM | POA: Diagnosis not present

## 2019-12-11 DIAGNOSIS — Z83719 Family history of colon polyps, unspecified: Secondary | ICD-10-CM

## 2019-12-11 DIAGNOSIS — K640 First degree hemorrhoids: Secondary | ICD-10-CM | POA: Insufficient documentation

## 2019-12-11 HISTORY — PX: COLONOSCOPY WITH PROPOFOL: SHX5780

## 2019-12-11 SURGERY — COLONOSCOPY WITH PROPOFOL
Anesthesia: General

## 2019-12-11 MED ORDER — DEXMEDETOMIDINE HCL 200 MCG/2ML IV SOLN
INTRAVENOUS | Status: DC | PRN
  Administered 2019-12-11: 20 ug via INTRAVENOUS

## 2019-12-11 MED ORDER — PROPOFOL 10 MG/ML IV BOLUS
INTRAVENOUS | Status: DC | PRN
  Administered 2019-12-11: 10 mg via INTRAVENOUS
  Administered 2019-12-11: 90 mg via INTRAVENOUS

## 2019-12-11 MED ORDER — PROPOFOL 500 MG/50ML IV EMUL
INTRAVENOUS | Status: DC | PRN
  Administered 2019-12-11: 150 ug/kg/min via INTRAVENOUS

## 2019-12-11 MED ORDER — SODIUM CHLORIDE 0.9 % IV SOLN
INTRAVENOUS | Status: DC
  Administered 2019-12-11: 1000 mL via INTRAVENOUS

## 2019-12-11 MED ORDER — LIDOCAINE HCL (CARDIAC) PF 100 MG/5ML IV SOSY
PREFILLED_SYRINGE | INTRAVENOUS | Status: DC | PRN
  Administered 2019-12-11: 100 mg via INTRAVENOUS

## 2019-12-11 MED ORDER — PROPOFOL 500 MG/50ML IV EMUL
INTRAVENOUS | Status: AC
  Filled 2019-12-11: qty 50

## 2019-12-11 NOTE — H&P (Signed)
Lucilla Lame, MD [address] [place], [place] [address] Phone:[phone] Fax : [phone]  Primary Care Physician:  Kirk Ruths, MD Primary Gastroenterologist:  Dr. Allen Norris  Pre-Procedure History & Physical: HPI:  Lisa Harrell is a 59 y.o. female is here for an colonoscopy.   Past Medical History:  Diagnosis Date  . Anxiety   . BRCA negative 2013   BRCA/Colaris neg  . Depression   . Family history of breast cancer    updated genetic testing letter sent 9/18, 1/20  . Family history of ovarian cancer   . Gastro-esophageal reflux disease with esophagitis   . IBS (irritable bowel syndrome)   . Incontinence of urine   . Migraine   . Obesity     Past Surgical History:  Procedure Laterality Date  . CHOLECYSTECTOMY    . ENDOMETRIAL ABLATION    . LIPOSUCTION    . TENDON REPAIR    . TUBAL LIGATION      Prior to Admission medications   Medication Sig Start Date End Date Taking? Authorizing Provider  acetaminophen (TYLENOL) 325 MG tablet Take by mouth.   Yes [provider]  buPROPion (WELLBUTRIN XL) 150 MG 24 hr tablet  06/18/18   [provider]  estradiol (ESTRACE) 1 MG tablet Take by mouth. 06/18/14 06/18/15  [provider]  estradiol (ESTRACE) 1 MG tablet  06/18/18   [provider]  gabapentin (NEURONTIN) 100 MG capsule  08/28/18   [provider]  medroxyPROGESTERone (PROVERA) 2.5 MG tablet Take by mouth. 06/18/14 06/18/15  [provider]  medroxyPROGESTERone (PROVERA) 2.5 MG tablet  06/18/18   [provider]  propranolol (INDERAL) 20 MG tablet Take by mouth. 06/18/14 06/18/15  [provider]    Allergies as of 10/28/2019 - Review Complete 10/28/2019  Allergen Reaction Noted  . Oseltamivir Other (See Comments) 10/29/2017  . Miconazole Other (See Comments) 05/09/2011  . Hyoscyamine sulfate Rash 05/10/2011    Family History  Problem Relation Age of Onset  .  Prostate cancer Father   . Prostate cancer Brother   . Ovarian cancer Paternal Aunt   . Colon cancer Paternal Aunt   . Breast cancer Paternal Aunt   . Esophageal cancer Paternal Uncle   . Breast cancer Maternal Grandmother     Social History   Socioeconomic History  . Marital status: Married    Spouse name: Not on file  . Number of children: Not on file  . Years of education: Not on file  . Highest education level: Not on file  Occupational History  . Not on file  Tobacco Use  . Smoking status: Never Smoker  . Smokeless tobacco: Never Used  Substance and Sexual Activity  . Alcohol use: Yes  . Drug use: No  . Sexual activity: Yes    Birth control/protection: Surgical  Other Topics Concern  . Not on file  Social History Narrative  . Not on file   Social Determinants of Health   Financial Resource Strain:   . Difficulty of Paying Living Expenses:   Food Insecurity:   . Worried About Charity fundraiser in the Last Year:   . Arboriculturist in the Last Year:   Transportation Needs:   . Film/video editor (Medical):   Marland Kitchen Lack of Transportation (Non-Medical):   Physical Activity:   . Days of Exercise per Week:   . Minutes of Exercise per Session:   Stress:   . Feeling  of Stress :   Social Connections:   . Frequency of Communication with Friends and Family:   . Frequency of Social Gatherings with Friends and Family:   . Attends Religious Services:   . Active Member of Clubs or Organizations:   . Attends Archivist Meetings:   Marland Kitchen Marital Status:   Intimate Partner Violence:   . Fear of Current or Ex-Partner:   . Emotionally Abused:   Marland Kitchen Physically Abused:   . Sexually Abused:     Review of Systems: See HPI, otherwise negative ROS  Physical Exam: BP (!) 134/104   Temp 98.5 F (36.9 C) (Temporal)   Resp 18   Ht '5\' 7"'$  (1.702 m)   Wt 99.1 kg   SpO2 100%   BMI 34.21 kg/m  General:   Alert,  pleasant and cooperative in NAD Head:  Normocephalic  and atraumatic. Neck:  Supple; no masses or thyromegaly. Lungs:  Clear throughout to auscultation.    Heart:  Regular rate and rhythm. Abdomen:  Soft, nontender and nondistended. Normal bowel sounds, without guarding, and without rebound.   Neurologic:  Alert and  oriented x4;  grossly normal neurologically.  Impression/Plan: Lisa Harrell is here for an colonoscopy to be performed for family history of colon polyps  Risks, benefits, limitations, and alternatives regarding  colonoscopy have been reviewed with the patient.  Questions have been answered.  All parties agreeable.   Lucilla Lame, MD  12/11/2019, 9:21 AM

## 2019-12-11 NOTE — Anesthesia Postprocedure Evaluation (Signed)
Anesthesia Post Note  Patient: Lisa Harrell  Procedure(s) Performed: COLONOSCOPY WITH PROPOFOL (N/A )  Patient location during evaluation: Endoscopy Anesthesia Type: General Level of consciousness: awake and alert and oriented Pain management: pain level controlled Vital Signs Assessment: post-procedure vital signs reviewed and stable Respiratory status: spontaneous breathing, nonlabored ventilation and respiratory function stable Cardiovascular status: blood pressure returned to baseline and stable Postop Assessment: no signs of nausea or vomiting Anesthetic complications: no     Last Vitals:  Vitals:   12/11/19 1011 12/11/19 1021  BP: 117/73 (!) 142/84  Pulse: (!) 56 (!) 57  Resp: 17 (!) 21  Temp:    SpO2: 98% 100%    Last Pain:  Vitals:   12/11/19 1021  TempSrc:   PainSc: 0-No pain                 Lunell Robart

## 2019-12-11 NOTE — Op Note (Signed)
[facility] Patient Name: Lisa Harrell Procedure Date: 12/11/2019 9:38 AM MRN: [id number] Account #: [id number] Date of Birth: 11-03-1960 Admit Type: Outpatient Age: 59 Room: [facility] ENDO ROOM 4 Gender: Female Note Status: Finalized Procedure:             Colonoscopy Indications:           Family history of advanced adenoma of the colon in a                         first-degree relative before age 30 years Providers:             Lucilla Lame MD, MD Referring MD:          Ocie Cornfield. Ouida Sills MD, MD (Referring MD) Medicines:             Propofol per Anesthesia Complications:         No immediate complications. Procedure:             Pre-Anesthesia Assessment:                        - Prior to the procedure, a History and Physical was                         performed, and patient medications and allergies were                         reviewed. The patient's tolerance of previous                         anesthesia was also reviewed. The risks and benefits                         of the procedure and the sedation options and risks                         were discussed with the patient. All questions were                         answered, and informed consent was obtained. Prior                         Anticoagulants: The patient has taken no previous                         anticoagulant or antiplatelet agents. ASA Grade                         Assessment: II - A patient with mild systemic disease.                         After reviewing the risks and benefits, the patient                         was deemed in satisfactory condition to undergo the                         procedure.  After obtaining informed consent, the colonoscope was                         passed under direct vision. Throughout the procedure,                         the patient's blood pressure, pulse, and oxygen                         saturations were  monitored continuously. The                         Colonoscope was introduced through the anus and                         advanced to the the cecum, identified by appendiceal                         orifice and ileocecal valve. The colonoscopy was                         performed without difficulty. The patient tolerated                         the procedure well. The quality of the bowel                         preparation was excellent. Findings:      The perianal and digital rectal examinations were normal.      Non-bleeding internal hemorrhoids were found during retroflexion. The       hemorrhoids were Grade I (internal hemorrhoids that do not prolapse). Impression:            - Non-bleeding internal hemorrhoids.                        - No specimens collected. Recommendation:        - Discharge patient to home.                        - Resume previous diet.                        - Continue present medications.                        - Repeat colonoscopy in 5 years for surveillance. Procedure Code(s):     --- Professional ---                        [phone], Colonoscopy, flexible; diagnostic, including                         collection of specimen(s) by brushing or washing, when                         performed (separate procedure) Diagnosis Code(s):     --- Professional ---                        Z83.71, Family history of colonic polyps  CPT copyright 2019 American Medical Association. All rights reserved. The codes documented in this report are preliminary and upon coder review may  be revised to meet current compliance requirements. Midge Minium MD, MD 12/11/2019 9:57:31 AM This report has been signed electronically. Number of Addenda: 0 Note Initiated On: 12/11/2019 9:38 AM Scope Withdrawal Time: 0 hours 8 minutes 39 seconds  Total Procedure Duration: 0 hours 11 minutes 56 seconds  Estimated Blood Loss:  Estimated blood loss: none.      [facility]

## 2019-12-11 NOTE — Anesthesia Preprocedure Evaluation (Signed)
Anesthesia Evaluation  Patient identified by MRN, date of birth, ID band Patient awake    Reviewed: Allergy & Precautions, H&P , NPO status , Patient's Chart, lab work & pertinent test results, reviewed documented beta blocker date and time   Airway Mallampati: II   Neck ROM: full    Dental  (+) Poor Dentition   Pulmonary neg pulmonary ROS,    Pulmonary exam normal        Cardiovascular Exercise Tolerance: Good negative cardio ROS Normal cardiovascular exam Rhythm:regular Rate:Normal     Neuro/Psych  Headaches, negative psych ROS   GI/Hepatic negative GI ROS, Neg liver ROS,   Endo/Other  negative endocrine ROS  Renal/GU negative Renal ROS  negative genitourinary   Musculoskeletal   Abdominal   Peds  Hematology negative hematology ROS (+)   Anesthesia Other Findings Past Medical History: No date: Anxiety 2013: BRCA negative     Comment:  BRCA/Colaris neg No date: Depression No date: Family history of breast cancer     Comment:  updated genetic testing letter sent 9/18, 1/20 No date: Family history of ovarian cancer No date: Gastro-esophageal reflux disease with esophagitis No date: IBS (irritable bowel syndrome) No date: Incontinence of urine No date: Migraine No date: Obesity Past Surgical History: No date: CHOLECYSTECTOMY No date: ENDOMETRIAL ABLATION No date: LIPOSUCTION No date: TENDON REPAIR No date: TUBAL LIGATION   Reproductive/Obstetrics negative OB ROS                             Anesthesia Physical Anesthesia Plan  ASA: II  Anesthesia Plan: General   Post-op Pain Management:    Induction:   PONV Risk Score and Plan:   Airway Management Planned:   Additional Equipment:   Intra-op Plan:   Post-operative Plan:   Informed Consent: I have reviewed the patients History and Physical, chart, labs and discussed the procedure including the risks, benefits and  alternatives for the proposed anesthesia with the patient or authorized representative who has indicated his/her understanding and acceptance.     Dental Advisory Given  Plan Discussed with: CRNA  Anesthesia Plan Comments:         Anesthesia Quick Evaluation

## 2019-12-11 NOTE — Transfer of Care (Signed)
Immediate Anesthesia Transfer of Care Note  Patient: Lisa Harrell  Procedure(s) Performed: COLONOSCOPY WITH PROPOFOL (N/A )  Patient Location: Endoscopy Unit  Anesthesia Type:General  Level of Consciousness: drowsy  Airway & Oxygen Therapy: Patient Spontanous Breathing  Post-op Assessment: Report given to RN and Post -op Vital signs reviewed and stable  Post vital signs: Reviewed and stable  Last Vitals:  Vitals Value Taken Time  BP 101/67 12/11/19 1001  Temp    Pulse 68 12/11/19 1001  Resp 19 12/11/19 1001  SpO2 99 % 12/11/19 1001    Last Pain:  Vitals:   12/11/19 0908  TempSrc: Temporal  PainSc: 0-No pain         Complications: No apparent anesthesia complications

## 2020-01-12 ENCOUNTER — Other Ambulatory Visit: Payer: Self-pay | Admitting: Internal Medicine

## 2020-01-20 ENCOUNTER — Telehealth: Payer: No Typology Code available for payment source | Admitting: Nurse Practitioner

## 2020-01-20 DIAGNOSIS — N3000 Acute cystitis without hematuria: Secondary | ICD-10-CM | POA: Diagnosis not present

## 2020-01-20 MED ORDER — CEPHALEXIN 500 MG PO CAPS: 500.0000 mg | ORAL_CAPSULE | Freq: Two times a day (BID) | ORAL | 0 refills | Status: DC

## 2020-01-20 NOTE — Progress Notes (Signed)

## 2020-10-06 ENCOUNTER — Other Ambulatory Visit: Payer: Self-pay | Admitting: Obstetrics and Gynecology

## 2020-10-06 DIAGNOSIS — Z1231 Encounter for screening mammogram for malignant neoplasm of breast: Secondary | ICD-10-CM

## 2020-11-01 ENCOUNTER — Ambulatory Visit: Payer: No Typology Code available for payment source | Admitting: Obstetrics and Gynecology

## 2020-11-15 ENCOUNTER — Encounter: Payer: Self-pay | Admitting: Obstetrics and Gynecology

## 2020-11-15 ENCOUNTER — Other Ambulatory Visit: Payer: Self-pay

## 2020-11-15 ENCOUNTER — Ambulatory Visit (INDEPENDENT_AMBULATORY_CARE_PROVIDER_SITE_OTHER): Payer: No Typology Code available for payment source | Admitting: Obstetrics and Gynecology

## 2020-11-15 VITALS — BP 130/80 | Ht 67.0 in | Wt 224.0 lb

## 2020-11-15 DIAGNOSIS — Z01419 Encounter for gynecological examination (general) (routine) without abnormal findings: Secondary | ICD-10-CM

## 2020-11-15 DIAGNOSIS — Z1339 Encounter for screening examination for other mental health and behavioral disorders: Secondary | ICD-10-CM

## 2020-11-15 DIAGNOSIS — Z1331 Encounter for screening for depression: Secondary | ICD-10-CM | POA: Diagnosis not present

## 2020-11-15 NOTE — Progress Notes (Signed)
Routine Annual Gynecology Examination   PCP: Kirk Ruths, MD  Chief Complaint  Patient presents with  . Annual Exam   History of Present Illness: Patient is a 60 y.o. G2P2002 presents for annual exam. The patient has no complaints today.   Menopausal bleeding: denies  Menopausal symptoms: Denies symptoms.  Breast symptoms: denies  Last pap smear: 3.5 years ago.  Result Normal, HPV negative  Last mammogram: 1 years ago.  Result Normal.  Scheduled for 11/18/2020.   Last Colonoscopy: 1 year ago. Results: normal. Follow up in 5 years.   Has received both covid vaccine shots.   No longer taking HRT.  Past Medical History:  Diagnosis Date  . Anxiety   . BRCA negative 2013   BRCA/Colaris neg  . Depression   . Family history of breast cancer    updated genetic testing letter sent 9/18, 1/20  . Family history of ovarian cancer   . Gastro-esophageal reflux disease with esophagitis   . IBS (irritable bowel syndrome)   . Incontinence of urine   . Migraine   . Obesity     Past Surgical History:  Procedure Laterality Date  . CHOLECYSTECTOMY    . COLONOSCOPY WITH PROPOFOL N/A 12/11/2019   Procedure: COLONOSCOPY WITH PROPOFOL;  Surgeon: Lucilla Lame, MD;  Location: [facility] ENDOSCOPY;  Service: Endoscopy;  Laterality: N/A;  . ENDOMETRIAL ABLATION    . LIPOSUCTION    . TENDON REPAIR    . TUBAL LIGATION      Prior to Admission medications: Denies     Allergies  Allergen Reactions  . Oseltamivir Other (See Comments)    Mouth sore, swollen joints  . Miconazole Other (See Comments)    Burning and irritation  . Hyoscyamine Sulfate Rash   Obstetric History: [id number]  Social History   Socioeconomic History  . Marital status: Married    Spouse name: Not on file  . Number of children: Not on file  . Years of education: Not on file  . Highest education level: Not on file  Occupational History  . Not on file  Tobacco Use  . Smoking status: Never Smoker  .  Smokeless tobacco: Never Used  Vaping Use  . Vaping Use: Never used  Substance and Sexual Activity  . Alcohol use: Yes  . Drug use: No  . Sexual activity: Yes    Birth control/protection: Surgical  Other Topics Concern  . Not on file  Social History Narrative  . Not on file   Social Determinants of Health   Financial Resource Strain: Not on file  Food Insecurity: Not on file  Transportation Needs: Not on file  Physical Activity: Not on file  Stress: Not on file  Social Connections: Not on file  Intimate Partner Violence: Not on file    Family History  Problem Relation Age of Onset  . Prostate cancer Father   . Prostate cancer Brother   . Ovarian cancer Paternal Aunt   . Colon cancer Paternal Aunt   . Breast cancer Paternal Aunt   . Esophageal cancer Paternal Uncle   . Breast cancer Maternal Grandmother    Review of Systems  Constitutional: Negative.   HENT: Negative.   Eyes: Negative.   Respiratory: Positive for shortness of breath (chronic esp with allergies) and wheezing. Negative for cough, hemoptysis and sputum production.   Cardiovascular: Negative.   Gastrointestinal: Positive for diarrhea (normal for her). Negative for abdominal pain, blood in stool, constipation, heartburn, melena, nausea and vomiting.  Genitourinary: Negative.   Musculoskeletal: Negative.   Skin: Negative.   Neurological: Positive for headaches (her normal headaches). Negative for dizziness, tingling, tremors, sensory change, speech change, focal weakness, seizures, loss of consciousness and weakness.  Endo/Heme/Allergies: Positive for environmental allergies. Bruises/bleeds easily.  Psychiatric/Behavioral: Negative.      Physical Exam Vitals: BP 130/80   Ht _0  (1.702 m)   Wt 224 lb (101.6 kg)   BMI 35.08 kg/m   Physical Exam Constitutional:      General: She is not in acute distress.    Appearance: Normal appearance. She is well-developed.  Genitourinary:     Vulva and bladder  normal.     No vaginal discharge, erythema, tenderness or bleeding.      Right Adnexa: not tender, not full and no mass present.    Left Adnexa: not tender, not full and no mass present.    No cervical motion tenderness, discharge, lesion or polyp.     Uterus is not enlarged or tender.     No uterine mass detected. Breasts:     Right: No inverted nipple, mass, nipple discharge, skin change or tenderness.     Left: No inverted nipple, mass, nipple discharge, skin change or tenderness.    HENT:     Head: Normocephalic and atraumatic.  Eyes:     General: No scleral icterus.    Conjunctiva/sclera: Conjunctivae normal.  Neck:     Thyroid: No thyromegaly.  Cardiovascular:     Rate and Rhythm: Normal rate and regular rhythm.     Heart sounds: No murmur heard. No friction rub. No gallop.   Pulmonary:     Effort: Pulmonary effort is normal. No respiratory distress.     Breath sounds: Normal breath sounds. No wheezing or rales.  Abdominal:     General: Bowel sounds are normal. There is no distension.     Palpations: Abdomen is soft. There is no mass.     Tenderness: There is no abdominal tenderness. There is no guarding or rebound.  Musculoskeletal:        General: No swelling or tenderness. Normal range of motion.     Cervical back: Normal range of motion and neck supple.  Lymphadenopathy:     Cervical: No cervical adenopathy.     Lower Body: No right inguinal adenopathy. No left inguinal adenopathy.  Neurological:     General: No focal deficit present.     Mental Status: She is alert and oriented to person, place, and time.     Cranial Nerves: No cranial nerve deficit.  Skin:    General: Skin is warm and dry.     Findings: No erythema or rash.  Psychiatric:        Mood and Affect: Mood normal.        Behavior: Behavior normal.        Judgment: Judgment normal.     Female chaperone present for pelvic and breast  portions of the physical exam  Results: AUDIT Questionnaire  (screen for alcoholism): 1 PHQ-9: 1   Assessment and Plan:  60 y.o. G37P2002 female here for routine annual gynecologic examination  Plan: Problem List Items Addressed This Visit   None   Visit Diagnoses    Women's annual routine gynecological examination    -  Primary   Screening for depression       Screening for alcoholism          Screening: -- Blood pressure screen normal -- Colonoscopy - not  due -- Mammogram - due - already scheduled at [facility] on 11/18/2020 -- Weight screening: overweight: continue to monitor -- Depression screening negative (PHQ-9) -- Nutrition: normal -- cholesterol screening: per PCP -- osteoporosis screening: not due -- tobacco screening: not using -- alcohol screening: AUDIT questionnaire indicates low-risk usage. -- family history of breast cancer screening: done. not at high risk. -- no evidence of domestic violence or intimate partner violence. -- STD screening: gonorrhea/chlamydia NAAT not collected per patient request. -- pap smear not collected per ASCCP guidelines -- flu vaccine received at work -- HPV vaccination series: not Lanetta Inch, MD 11/15/2020 9:03 AM

## 2020-11-18 ENCOUNTER — Ambulatory Visit
Admission: RE | Admit: 2020-11-18 | Discharge: 2020-11-18 | Disposition: A | Discharge status: Home / Self Care | Payer: No Typology Code available for payment source | Source: Ambulatory Visit | Attending: Obstetrics and Gynecology | Admitting: Obstetrics and Gynecology

## 2020-11-18 ENCOUNTER — Other Ambulatory Visit: Payer: Self-pay

## 2020-11-18 DIAGNOSIS — Z1231 Encounter for screening mammogram for malignant neoplasm of breast: Secondary | ICD-10-CM | POA: Insufficient documentation

## 2020-12-01 ENCOUNTER — Other Ambulatory Visit: Payer: Self-pay

## 2020-12-01 MED ORDER — METHYLPREDNISOLONE 4 MG PO TBPK
ORAL_TABLET | ORAL | 0 refills | Status: AC
  Filled 2020-12-01: qty 21, 6d supply, fill #0

## 2020-12-01 MED ORDER — AZITHROMYCIN 250 MG PO TABS
ORAL_TABLET | ORAL | 0 refills | Status: AC
  Filled 2020-12-01: qty 6, 5d supply, fill #0

## 2020-12-01 MED ORDER — BENZONATATE 100 MG PO CAPS
ORAL_CAPSULE | ORAL | 0 refills | Status: AC
  Filled 2020-12-01: qty 21, 7d supply, fill #0

## 2020-12-09 ENCOUNTER — Other Ambulatory Visit: Payer: Self-pay

## 2020-12-09 MED ORDER — BENZONATATE 200 MG PO CAPS
ORAL_CAPSULE | ORAL | 0 refills | Status: AC
  Filled 2020-12-09: qty 20, 7d supply, fill #0

## 2020-12-09 MED ORDER — AMOXICILLIN-POT CLAVULANATE 875-125 MG PO TABS
ORAL_TABLET | ORAL | 0 refills | Status: AC
  Filled 2020-12-09: qty 28, 14d supply, fill #0

## 2020-12-09 MED ORDER — FLUTICASONE-SALMETEROL 250-50 MCG/DOSE IN AEPB
INHALATION_SPRAY | RESPIRATORY_TRACT | 2 refills | Status: AC
  Filled 2020-12-09: qty 60, 30d supply, fill #0

## 2020-12-15 ENCOUNTER — Other Ambulatory Visit: Payer: Self-pay

## 2020-12-15 MED ORDER — FLUCONAZOLE 100 MG PO TABS
ORAL_TABLET | ORAL | 0 refills | Status: AC
  Filled 2020-12-15: qty 7, 7d supply, fill #0

## 2020-12-15 MED FILL — Cyclobenzaprine HCl Tab 5 MG: ORAL | 30 days supply | Qty: 30 | Fill #0 | Status: AC

## 2021-01-13 ENCOUNTER — Other Ambulatory Visit: Payer: Self-pay

## 2021-01-13 MED ORDER — MELOXICAM 15 MG PO TABS
ORAL_TABLET | ORAL | 11 refills | Status: AC
  Filled 2021-01-13: qty 90, 90d supply, fill #0

## 2021-01-13 MED ORDER — SUMATRIPTAN SUCCINATE 100 MG PO TABS
ORAL_TABLET | ORAL | 5 refills | Status: AC
  Filled 2021-01-13: qty 9, 30d supply, fill #0

## 2021-03-10 ENCOUNTER — Other Ambulatory Visit: Payer: Self-pay

## 2021-03-13 ENCOUNTER — Other Ambulatory Visit: Payer: Self-pay

## 2021-03-14 ENCOUNTER — Other Ambulatory Visit: Payer: Self-pay

## 2021-03-14 MED ORDER — CYCLOBENZAPRINE HCL 5 MG PO TABS
ORAL_TABLET | ORAL | 0 refills | Status: AC
  Filled 2021-03-14: qty 30, 30d supply, fill #0

## 2021-03-14 MED ORDER — PANTOPRAZOLE SODIUM 40 MG PO TBEC
40.0000 mg | DELAYED_RELEASE_TABLET | Freq: Every day | ORAL | 0 refills | Status: AC
  Filled 2021-03-14: qty 30, 30d supply, fill #0

## 2021-09-06 IMAGING — MG DIGITAL SCREENING BILAT W/ TOMO W/ CAD
8 series · 8 of 24 positions shown · non-contrast
Comparison: Previous exam(s).

CLINICAL DATA: Screening.

EXAM:
DIGITAL SCREENING BILATERAL MAMMOGRAM WITH TOMO AND CAD

[R MLO synth-2D]
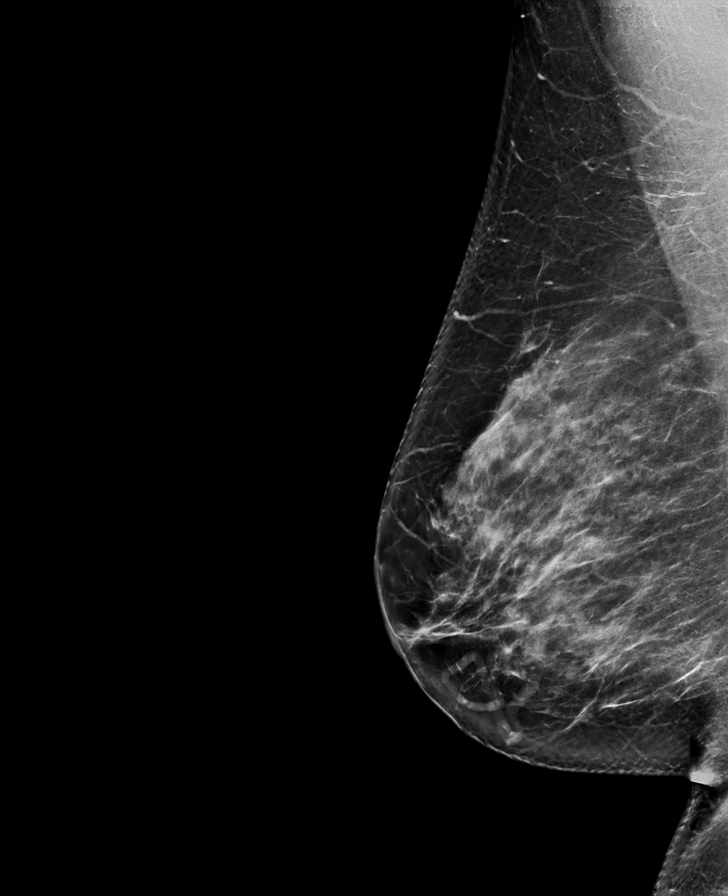

[R CC synth-2D]
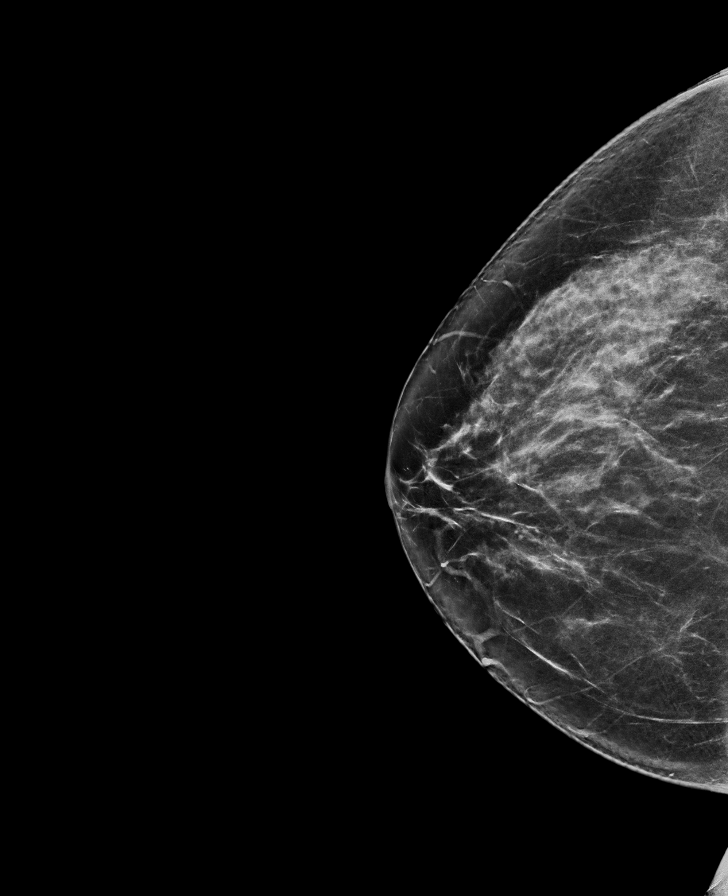

[L CC synth-2D]
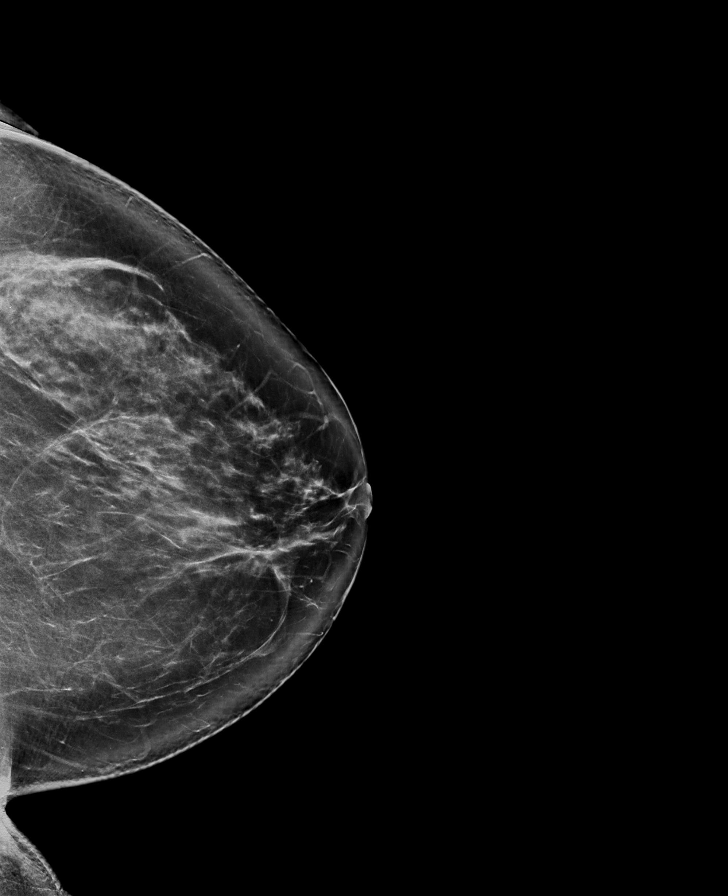

[L MLO synth-2D]
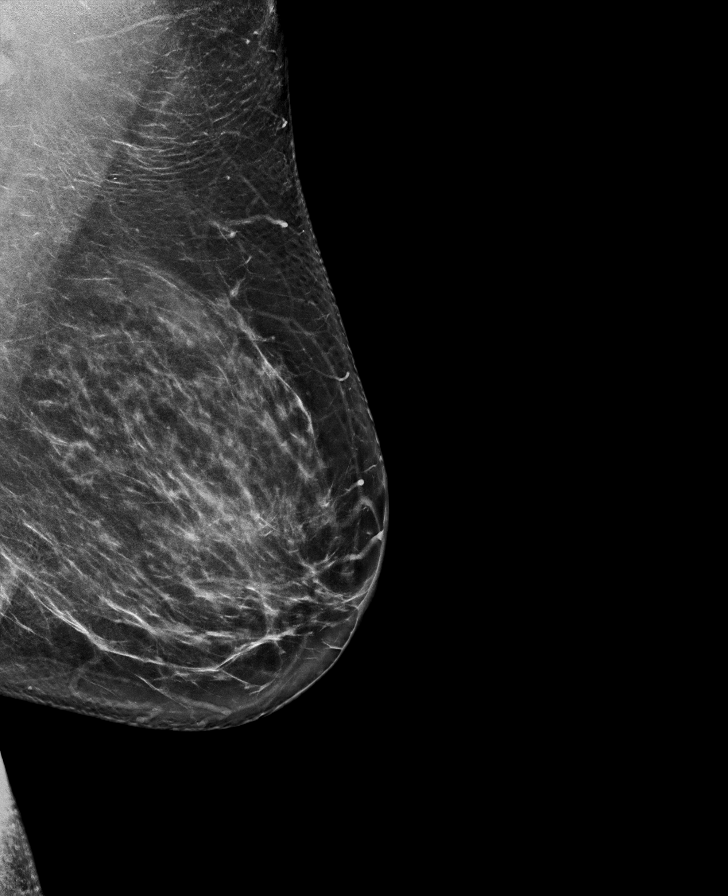

[L CC tomo · tomo slice 46/91.0]
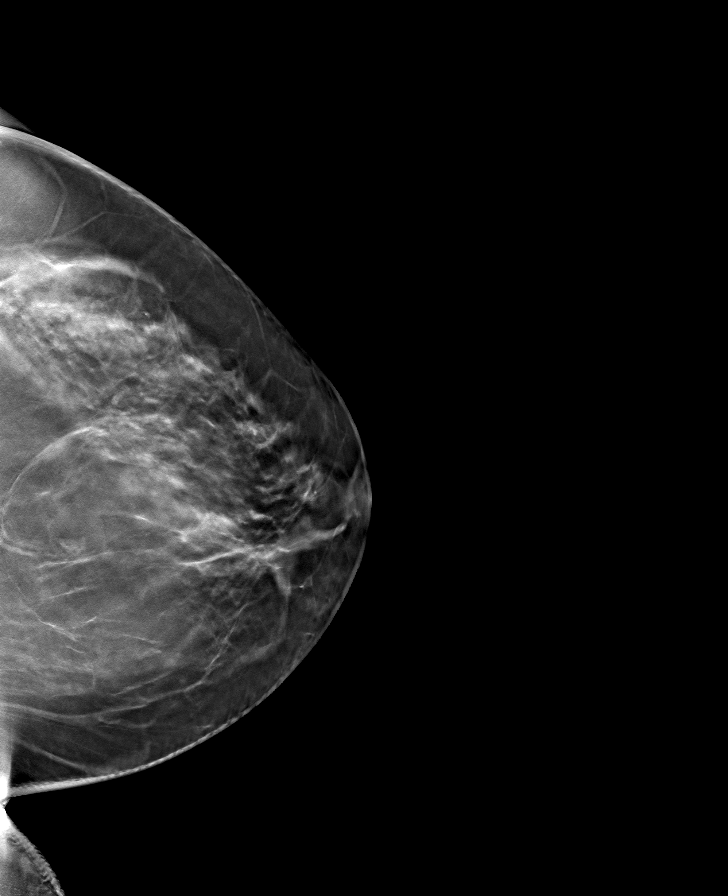

[L MLO tomo · tomo slice 45/88.0]
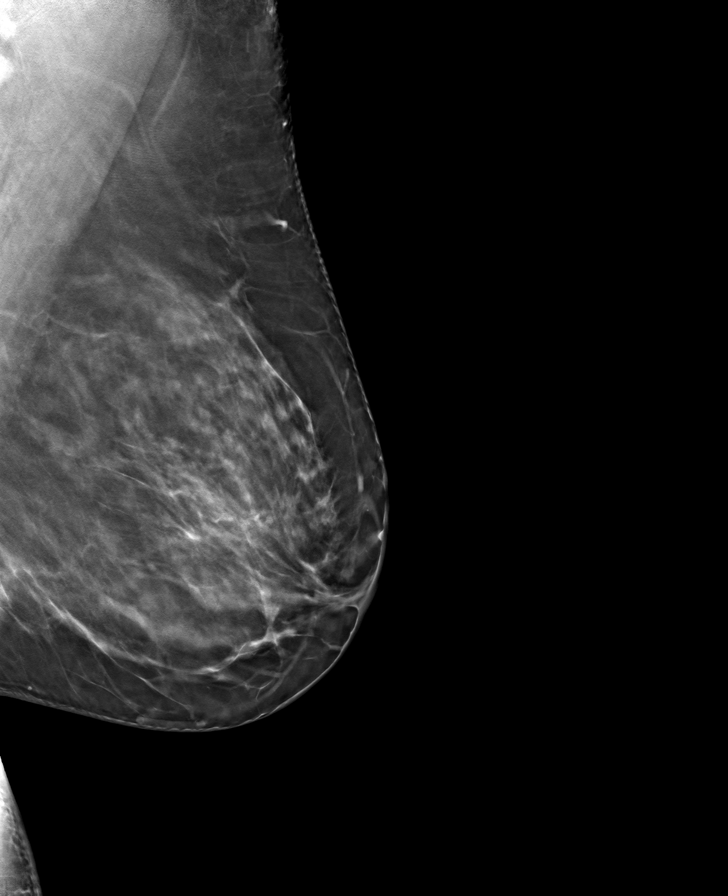

[R CC tomo · tomo slice 41/80.0]
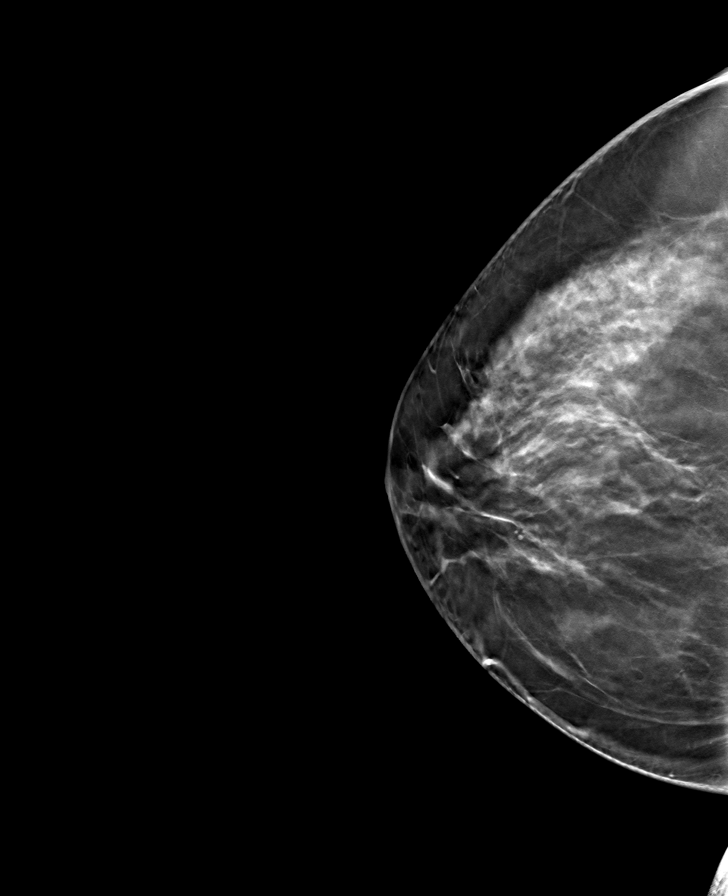

[R MLO tomo · tomo slice 45/89.0]
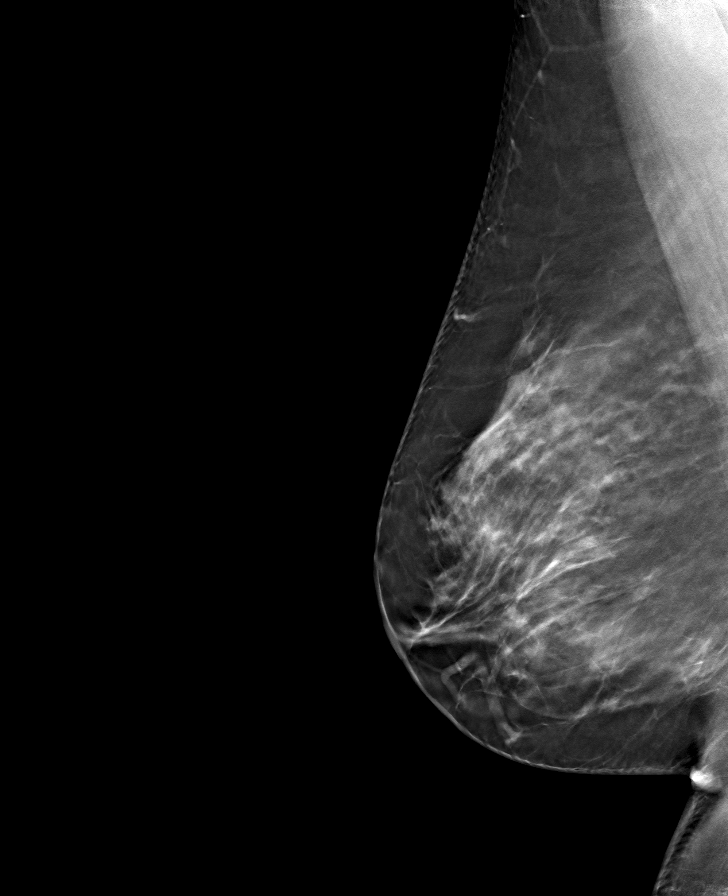

[8 of 24 positions shown; findings below may reference images not displayed]

ACR Breast Density Category c: The breast tissue is heterogeneously
dense, which may obscure small masses.
FINDINGS: There are no findings suspicious for malignancy. Images were
processed with CAD.
IMPRESSION: No mammographic evidence of malignancy. A result letter of this
screening mammogram will be mailed directly to the patient.

RECOMMENDATION:
Screening mammogram in one year. (Code:FT-U-LHB)

BI-RADS CATEGORY  1: Negative.
